# Patient Record
Sex: Female | Born: 1960 | Race: White | Hispanic: Yes | Marital: Married
Health system: Southern US, Community
[De-identification: ages and names within clinical notes are randomized; demographics above are authoritative.]

## PROBLEM LIST (undated history)

## (undated) DIAGNOSIS — I10 Essential (primary) hypertension: Secondary | ICD-10-CM

---

## 2017-12-03 ENCOUNTER — Emergency Department (HOSPITAL_COMMUNITY): Payer: Self-pay

## 2017-12-03 ENCOUNTER — Emergency Department (HOSPITAL_COMMUNITY)
Admission: EM | Admit: 2017-12-03 | Discharge: 2017-12-04 | Disposition: A | Discharge status: Home / Self Care | Payer: Self-pay | Attending: Emergency Medicine | Admitting: Emergency Medicine

## 2017-12-03 ENCOUNTER — Other Ambulatory Visit: Payer: Self-pay

## 2017-12-03 ENCOUNTER — Encounter (HOSPITAL_COMMUNITY): Payer: Self-pay

## 2017-12-03 DIAGNOSIS — I10 Essential (primary) hypertension: Secondary | ICD-10-CM | POA: Insufficient documentation

## 2017-12-03 DIAGNOSIS — R079 Chest pain, unspecified: Secondary | ICD-10-CM | POA: Insufficient documentation

## 2017-12-03 HISTORY — DX: Essential (primary) hypertension: I10

## 2017-12-03 LAB — CBC WITH DIFFERENTIAL/PLATELET
Abs Immature Granulocytes: 0 10*3/uL (ref 0.0–0.1)
Basophils Absolute: 0 10*3/uL (ref 0.0–0.1)
Basophils Relative: 0 %
Eosinophils Absolute: 0.1 10*3/uL (ref 0.0–0.7)
Eosinophils Relative: 1 %
HEMATOCRIT: 49.4 % — AB (ref 36.0–46.0)
Hemoglobin: 15.9 g/dL — ABNORMAL HIGH (ref 12.0–15.0)
Immature Granulocytes: 0 %
LYMPHS ABS: 2.2 10*3/uL (ref 0.7–4.0)
Lymphocytes Relative: 27 %
MCH: 30 pg (ref 26.0–34.0)
MCHC: 32.2 g/dL (ref 30.0–36.0)
MCV: 93.2 fL (ref 78.0–100.0)
Monocytes Absolute: 0.7 10*3/uL (ref 0.1–1.0)
Monocytes Relative: 9 %
Neutro Abs: 5.3 10*3/uL (ref 1.7–7.7)
Neutrophils Relative %: 63 %
Platelets: 296 10*3/uL (ref 150–400)
RBC: 5.3 MIL/uL — AB (ref 3.87–5.11)
RDW: 12.1 % (ref 11.5–15.5)
WBC: 8.4 10*3/uL (ref 4.0–10.5)

## 2017-12-03 LAB — BASIC METABOLIC PANEL
Anion gap: 10 (ref 5–15)
BUN: 16 mg/dL (ref 6–20)
CALCIUM: 10.5 mg/dL — AB (ref 8.9–10.3)
CO2: 25 mmol/L (ref 22–32)
CREATININE: 1.01 mg/dL — AB (ref 0.44–1.00)
Chloride: 106 mmol/L (ref 101–111)
GFR calc non Af Amer: >60 mL/min (ref >60)
GLUCOSE: 90 mg/dL (ref 65–99)
Potassium: 4.5 mmol/L (ref 3.5–5.1)
Sodium: 141 mmol/L (ref 135–145)

## 2017-12-03 LAB — TROPONIN I: Troponin I: <0.03 ng/mL (ref <0.03)

## 2017-12-03 MED ORDER — SODIUM CHLORIDE 0.9 % IV SOLN
INTRAVENOUS | Status: DC
  Administered 2017-12-03: 22:00:00 via INTRAVENOUS

## 2017-12-03 NOTE — ED Provider Notes (Signed)
[facility]   CSN: [id number] Arrival date & time: 12/03/17  2119     History   Chief Complaint Chief Complaint  Patient presents with  . Chest Pain    HPI Courtney Luna is a 57 y.o. female.  The history is provided by the patient and medical records. The history is limited by a language barrier. A language interpreter was used.  Chest Pain   Associated symptoms include shortness of breath.    57 year old female with history of hypertension, presenting to the ED with chest pain.  History is obtained via language interpreter as patient is primarily Spanish-speaking.  Reports she got into an argument with some family members and almost immediately afterwards she had sensation that "her breath was taken away".  Reports she had associated generalized pain throughout her entire body including her chest.  This was short-lived and then resolved.  She had no associated nausea, vomiting, dizziness, weakness, diaphoresis, or feelings of syncope.  She is currently asymptomatic here in the ER.  She denies history of similar episodes in the past.  She has no known cardiac history.  She is not a smoker.  Denies any illicit drug use.  Past Medical History:  Diagnosis Date  . Hypertension     There are no active problems to display for this patient.      OB History   None      Home Medications    Prior to Admission medications   Not on File    Family History No family history on file.  Social History Social History   Tobacco Use  . Smoking status: Not on file  Substance Use Topics  . Alcohol use: Not on file  . Drug use: Not on file     Allergies   Patient has no known allergies.   Review of Systems Review of Systems  Respiratory: Positive for shortness of breath.   Cardiovascular: Positive for chest pain.  All other systems reviewed and are negative.    Physical Exam Updated Vital Signs BP (!) 176/93    Pulse 70   Temp 97.8 F (36.6 C) (Oral)   Resp 16   Ht 5\' 2"  (1.575 m)   SpO2 94%   Physical Exam  Constitutional: She is oriented to person, place, and time. She appears well-developed and well-nourished.  HENT:  Head: Normocephalic and atraumatic.  Mouth/Throat: Oropharynx is clear and moist.  Eyes: Pupils are equal, round, and reactive to light. Conjunctivae and EOM are normal.  Neck: Normal range of motion.  Cardiovascular: Normal rate, regular rhythm and normal heart sounds.  Pulmonary/Chest: Effort normal and breath sounds normal. She has no decreased breath sounds. She has no wheezes. She has no rales.  Abdominal: Soft. Bowel sounds are normal.  Musculoskeletal: Normal range of motion.  Neurological: She is alert and oriented to person, place, and time.  Skin: Skin is warm and dry.  Psychiatric: She has a normal mood and affect.  Nursing note and vitals reviewed.    ED Treatments / Results  Labs (all labs ordered are listed, but only abnormal results are displayed) Labs Reviewed  CBC WITH DIFFERENTIAL/PLATELET - Abnormal; Notable for the following components:      Result Value   RBC 5.30 (*)    Hemoglobin 15.9 (*)    HCT 49.4 (*)    All other components within normal limits  BASIC METABOLIC PANEL - Abnormal; Notable for the following components:   Creatinine,  Ser 1.01 (*)    Calcium 10.5 (*)    All other components within normal limits  TROPONIN I    EKG EKG Interpretation  Date/Time:  Friday December 03 2017 21:50:23 EDT Ventricular Rate:  67 PR Interval:    QRS Duration: 98 QT Interval:  437 QTC Calculation: 462 R Axis:   38 Text Interpretation:  Sinus rhythm Probable left ventricular hypertrophy Confirmed by Lorre Nick ([id number]) on 12/03/2017 11:51:42 PM   Radiology Dg Chest 2 View  Result Date: 12/03/2017 CLINICAL DATA:  Chest pain EXAM: CHEST - 2 VIEW COMPARISON:  None. FINDINGS: The heart size is borderline. The hila and mediastinum are  unremarkable. No pneumothorax. No pulmonary nodules or masses. Mild bibasilar atelectasis. No focal infiltrate. IMPRESSION: No active cardiopulmonary disease. Electronically Signed   By: Gerome Sam III M.D   On: 12/03/2017 23:02    Procedures Procedures (including critical care time)  Medications Ordered in ED Medications - No data to display   Initial Impression / Assessment and Plan / ED Course  I have reviewed the triage vital signs and the nursing notes.  Pertinent labs & imaging results that were available during my care of the patient were reviewed by me and considered in my medical decision making (see chart for details).  57 year old female presenting to the ED with episode of chest pain after arguing with her family.  Reports generalized chest pressure with sensation of "losing her breath".  Symptoms resolved shortly after without recurrence.  She has no known cardiac history.  Remains asymptomatic here in the ED.  EKG is nonischemic.  Screening labs overall reassuring.  Chest x-ray is clear.  Patient remains pain-free throughout ED visit.  Suspect this may have been a mild stress reaction versus some component of anxiety.  Low suspicion for ACS, PE, dissection, acute cardiac event.  Feel she stable for discharge home.  We will have her follow-up closely with PCP, does not have one listed so will refer to patient care center.  Discussed plan with patient, she acknowledged understanding and agreed with plan of care.  Return precautions given for new or worsening symptoms.  Final Clinical Impressions(s) / ED Diagnoses   Final diagnoses:  Chest pain    ED Discharge Orders    None       Garlon Hatchet, PA-C 12/04/17 0346    Lorre Nick, MD 12/05/17 [phone]

## 2017-12-03 NOTE — ED Triage Notes (Addendum)
Per [facility], pt arriving from home after having an argument with family and then feeling dull chest pain and bilateral arm pain for 1 hour. Pt received 324 ASA and 1 NTG. Pt denies any current chest pain or N/V. Pt is spanish speaking.

## 2017-12-03 NOTE — ED Notes (Signed)
ED Provider at bedside. 

## 2017-12-03 NOTE — ED Notes (Signed)
Patient transported to X-ray 

## 2017-12-04 NOTE — ED Notes (Signed)
Signature pad unavailable at time of pt discharge. Interpreter used, pt denied further requests.

## 2017-12-04 NOTE — Discharge Instructions (Signed)
All cardiac tests today were normal. Please follow-up with your primary care doctor.  If you do not have one, you can follow-up with the patient care center. Return here for any new/acute changes.

## 2019-02-09 IMAGING — CR DG CHEST 2V
2 series · 2 of 2 positions shown · non-contrast
Comparison: None.

CLINICAL DATA: Chest pain

EXAM:
CHEST - 2 VIEW

[chest lat]
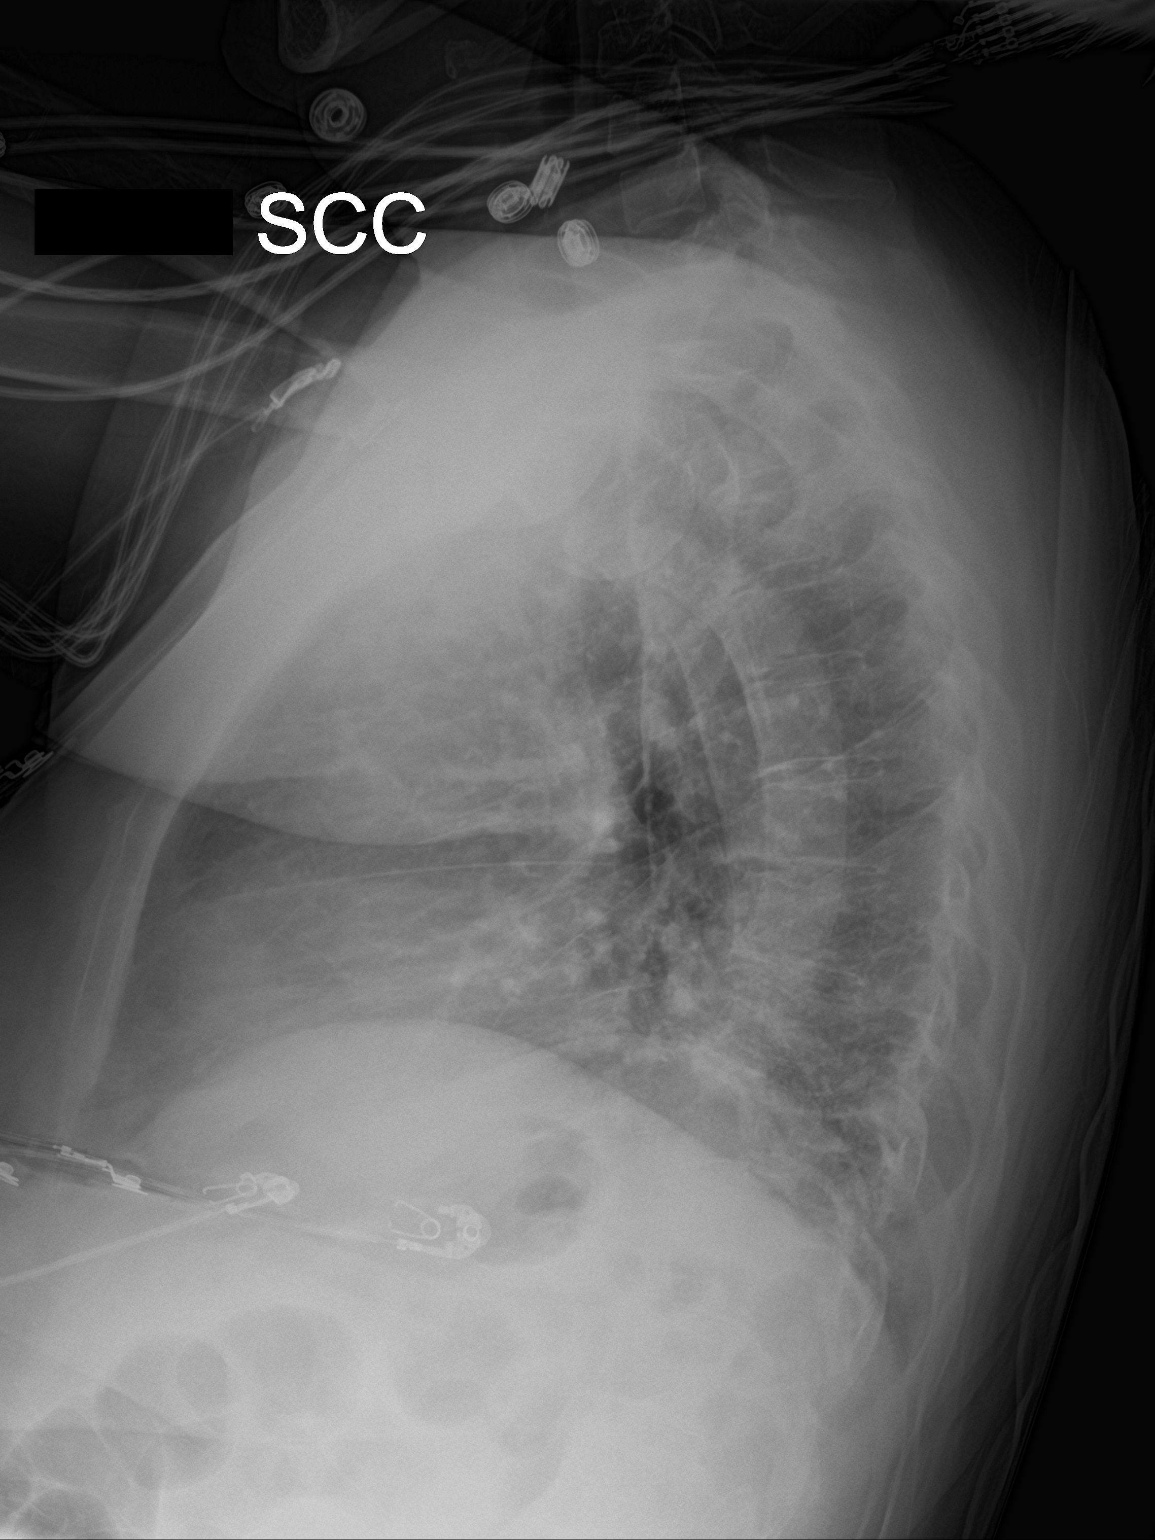

[chest ap]
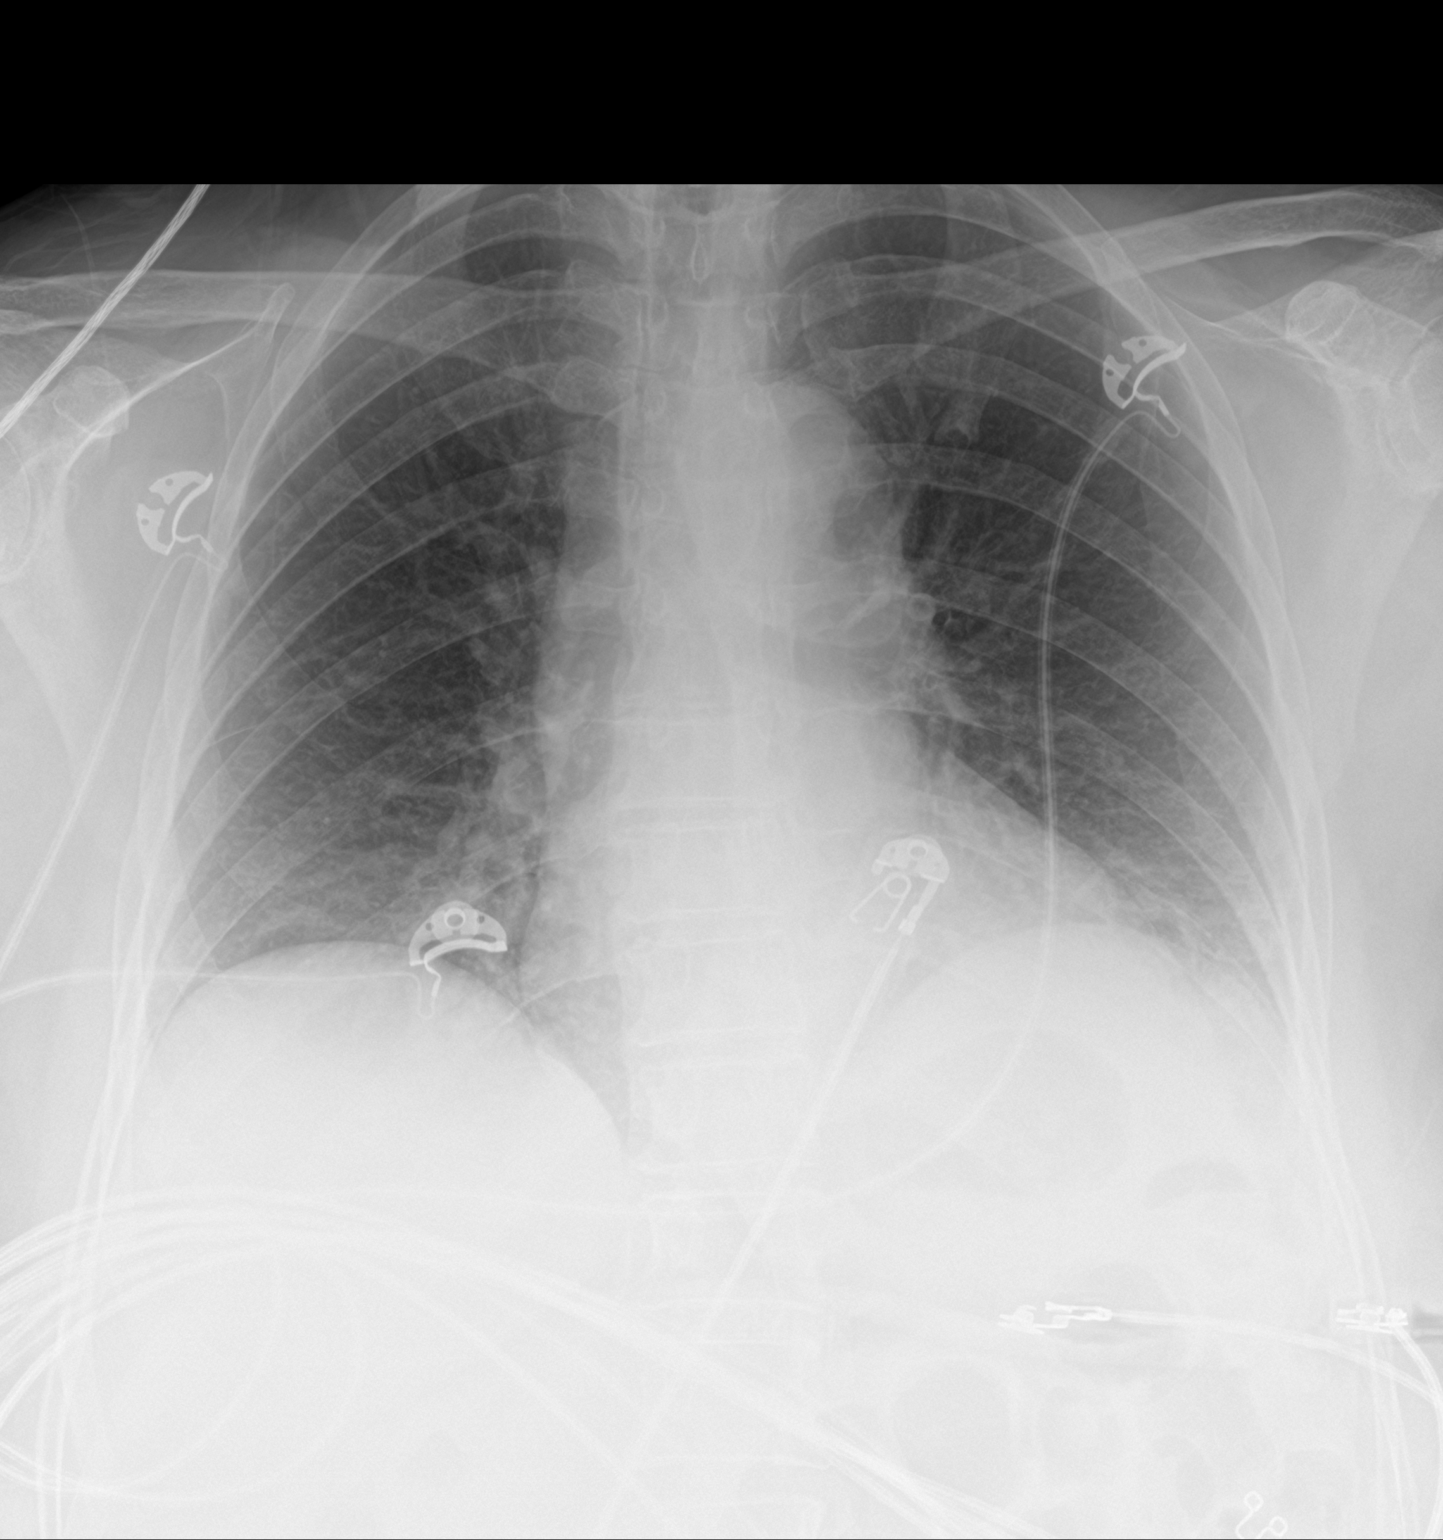

[2 of 2 positions shown; findings below may reference images not displayed]

FINDINGS: The heart size is borderline. The hila and mediastinum are
unremarkable. No pneumothorax. No pulmonary nodules or masses. Mild
bibasilar atelectasis. No focal infiltrate.
IMPRESSION: No active cardiopulmonary disease.
